# Patient Record
Sex: Male | Born: 1965 | Race: Black or African American | Hispanic: No | Marital: Married | Smoking: Current every day smoker
Health system: Southern US, Community
[De-identification: ages and names within clinical notes are randomized; demographics above are authoritative.]

## PROBLEM LIST (undated history)

## (undated) DIAGNOSIS — I10 Essential (primary) hypertension: Secondary | ICD-10-CM

## (undated) DIAGNOSIS — D539 Nutritional anemia, unspecified: Secondary | ICD-10-CM

## (undated) DIAGNOSIS — K644 Residual hemorrhoidal skin tags: Secondary | ICD-10-CM

## (undated) DIAGNOSIS — K5790 Diverticulosis of intestine, part unspecified, without perforation or abscess without bleeding: Secondary | ICD-10-CM

## (undated) DIAGNOSIS — E785 Hyperlipidemia, unspecified: Secondary | ICD-10-CM

## (undated) DIAGNOSIS — M109 Gout, unspecified: Secondary | ICD-10-CM

## (undated) DIAGNOSIS — K648 Other hemorrhoids: Secondary | ICD-10-CM

## (undated) DIAGNOSIS — K760 Fatty (change of) liver, not elsewhere classified: Secondary | ICD-10-CM

## (undated) HISTORY — DX: Hyperlipidemia, unspecified: E78.5

## (undated) HISTORY — DX: Essential (primary) hypertension: I10

## (undated) HISTORY — DX: Other hemorrhoids: K64.8

## (undated) HISTORY — DX: Residual hemorrhoidal skin tags: K64.4

## (undated) HISTORY — DX: Gout, unspecified: M10.9

## (undated) HISTORY — DX: Fatty (change of) liver, not elsewhere classified: K76.0

## (undated) HISTORY — DX: Diverticulosis of intestine, part unspecified, without perforation or abscess without bleeding: K57.90

## (undated) HISTORY — PX: HERNIA REPAIR: SHX51

## (undated) HISTORY — DX: Nutritional anemia, unspecified: D53.9

---

## 2010-08-08 ENCOUNTER — Ambulatory Visit (HOSPITAL_BASED_OUTPATIENT_CLINIC_OR_DEPARTMENT_OTHER): Admission: RE | Admit: 2010-08-08 | Discharge: 2010-08-08 | Payer: Self-pay | Admitting: Surgery

## 2011-01-26 LAB — POCT HEMOGLOBIN-HEMACUE: Hemoglobin: 15.6 g/dL (ref 13.0–17.0)

## 2020-02-04 ENCOUNTER — Encounter: Payer: Self-pay | Admitting: *Deleted

## 2020-02-04 ENCOUNTER — Telehealth: Payer: Self-pay | Admitting: Internal Medicine

## 2020-02-04 NOTE — Telephone Encounter (Signed)
Recv'd medical records from [facility](which included labs)forwared 9 pages to Dr. Erick Blinks via fax & interoffice 3/24/21fbg

## 2020-02-05 ENCOUNTER — Ambulatory Visit (INDEPENDENT_AMBULATORY_CARE_PROVIDER_SITE_OTHER): Payer: No Typology Code available for payment source | Admitting: Internal Medicine

## 2020-02-05 ENCOUNTER — Encounter: Payer: Self-pay | Admitting: *Deleted

## 2020-02-05 ENCOUNTER — Other Ambulatory Visit (INDEPENDENT_AMBULATORY_CARE_PROVIDER_SITE_OTHER): Payer: No Typology Code available for payment source

## 2020-02-05 VITALS — BP 126/66 | HR 103 | Temp 99.0°F | Ht 69.0 in | Wt 193.0 lb

## 2020-02-05 DIAGNOSIS — K625 Hemorrhage of anus and rectum: Secondary | ICD-10-CM

## 2020-02-05 DIAGNOSIS — Z01818 Encounter for other preprocedural examination: Secondary | ICD-10-CM | POA: Diagnosis not present

## 2020-02-05 DIAGNOSIS — R7989 Other specified abnormal findings of blood chemistry: Secondary | ICD-10-CM

## 2020-02-05 DIAGNOSIS — D539 Nutritional anemia, unspecified: Secondary | ICD-10-CM | POA: Diagnosis not present

## 2020-02-05 DIAGNOSIS — R55 Syncope and collapse: Secondary | ICD-10-CM

## 2020-02-05 DIAGNOSIS — F1010 Alcohol abuse, uncomplicated: Secondary | ICD-10-CM

## 2020-02-05 LAB — IBC PANEL
Iron: 238 ug/dL — ABNORMAL HIGH (ref 42–165)
Saturation Ratios: 53.5 % — ABNORMAL HIGH (ref 20.0–50.0)
Transferrin: 318 mg/dL (ref 212.0–360.0)

## 2020-02-05 LAB — CBC WITH DIFFERENTIAL/PLATELET
Basophils Absolute: 0 10*3/uL (ref 0.0–0.1)
Basophils Relative: 0.6 % (ref 0.0–3.0)
Eosinophils Absolute: 0 10*3/uL (ref 0.0–0.7)
Eosinophils Relative: 0.2 % (ref 0.0–5.0)
HCT: 35.8 % — ABNORMAL LOW (ref 39.0–52.0)
Hemoglobin: 12.4 g/dL — ABNORMAL LOW (ref 13.0–17.0)
Lymphocytes Relative: 30.3 % (ref 12.0–46.0)
Lymphs Abs: 2.3 10*3/uL (ref 0.7–4.0)
MCHC: 34.6 g/dL (ref 30.0–36.0)
MCV: 109.4 fl — ABNORMAL HIGH (ref 78.0–100.0)
Monocytes Absolute: 0.6 10*3/uL (ref 0.1–1.0)
Monocytes Relative: 8.3 % (ref 3.0–12.0)
Neutro Abs: 4.6 10*3/uL (ref 1.4–7.7)
Neutrophils Relative %: 60.6 % (ref 43.0–77.0)
Platelets: 196 10*3/uL (ref 150.0–400.0)
RBC: 3.28 Mil/uL — ABNORMAL LOW (ref 4.22–5.81)
RDW: 13.4 % (ref 11.5–15.5)
WBC: 7.6 10*3/uL (ref 4.0–10.5)

## 2020-02-05 LAB — VITAMIN B12: Vitamin B-12: 205 pg/mL — ABNORMAL LOW (ref 211–911)

## 2020-02-05 LAB — FERRITIN: Ferritin: 681.3 ng/mL — ABNORMAL HIGH (ref 22.0–322.0)

## 2020-02-05 LAB — FOLATE: Folate: 6.4 ng/mL (ref >5.9)

## 2020-02-05 NOTE — Patient Instructions (Addendum)
Your provider has requested that you go to the basement level for lab work before leaving today. Press "B" on the elevator. The lab is located at the first door on the left as you exit the elevator. ______________________________________________________  Please decrease your alcohol intake. ______________________________________________________  Make sure to drink plenty of fluids! Drink at least 64 ounces daily.  ______________________________________________________ Joel Stephenson have been scheduled for an abdominal ultrasound at [facility] (1st floor of hospital) on Tuesday 02/10/20 at 8:30 am. Please arrive 15 minutes prior to your appointment for registration. Make certain not to have anything to eat or drink 6 hours prior to your appointment. Should you need to reschedule your appointment, please contact radiology at [phone]. This test typically takes about 30 minutes to perform.  _______________________________________________________ Joel Stephenson have been scheduled for an endoscopy and colonoscopy. Please follow the written instructions given to you at your visit today. Please pick up your prep supplies at the pharmacy within the next 1-3 days. If you use inhalers (even only as needed), please bring them with you on the day of your procedure. Your physician has requested that you go to www.startemmi.com and enter the access code given to you at your visit today. This web site gives a general overview about your procedure. However, you should still follow specific instructions given to you by our office regarding your preparation for the procedure.  _____________________________________________________ If you are age 87 or older, your body mass index should be between 23-30. Your Body mass index is 28.5 kg/m. If this is out of the aforementioned range listed, please consider follow up with your Primary Care Provider.  If you are age 52 or younger, your body mass index should be between  19-25. Your Body mass index is 28.5 kg/m. If this is out of the aformentioned range listed, please consider follow up with your Primary Care Provider.  _____________________________________________________ Due to recent changes in healthcare laws, you may see the results of your imaging and laboratory studies on MyChart before your provider has had a chance to review them.  We understand that in some cases there may be results that are confusing or concerning to you. Not all laboratory results come back in the same time frame and the provider may be waiting for multiple results in order to interpret others.  Please give [place] 48 hours in order for your provider to thoroughly review all the results before contacting the office for clarification of your results.  _____________________________________________________  We gave given you a return to work note.

## 2020-02-05 NOTE — Progress Notes (Signed)
Patient ID: Joel Stephenson, male   DOB: [birth year], 54 y.o.   MRN: [id number] HPI: Joel Stephenson is a 54 year old male with a history of hypertension, hyperlipidemia and gout who is seen in consultation at the request of Marin Comment, FNP to evaluate rectal bleeding and blood in stools.  He is here alone today.  He reports that over the last year he has seen blood in his bowel movements.  Not daily but on a regular basis.  Blood described as red but at other times dark red.  Does not really occur with wiping but more mixed with stool.  He has a bowel movement about every 3 days.  At times he feels constipated but this is not specifically a change.  He reports he works in [employer] and tries to avoid having bowel movements at work.  He denies abdominal pain.  Good appetite.  Stable weight.  His weight has always fluctuated he says between 185 and 200 pounds.  He often gains weight during the winter and loses weight during the summer.  He has occasional heartburn without dysphagia or odynophagia.  He has never had a colonoscopy.  There is no known family history of colon cancer.  Separately he reports 3 episodes of syncope the last occurring about a week ago.  These have occurred when rising quickly from a seated position after having a bowel movement.  He reports he will stand after bowel movement, on these 3 occasions, feel lightheaded and sweaty on the back of his neck and then pass out.  Never happened while seated position nor while working.  No exertional chest pain or dyspnea.  I reviewed lab work from primary care and noticed his liver enzymes were elevated.  We discussed this today.  He was unaware.  He does report drinking alcohol more over the last few years.  He reports he does this to help sleep and also to help with his joint pain specifically his bilateral knee pain.  He reports he needs joint replacements in both knees.  He will drink about a half bottle of vodka most days and usually 1 beer.   He reports in the past he never really drank hard liquor but has become a habit recently.  No jaundice.  No increasing abdominal girth or lower extremity edema.  Past Medical History:  Diagnosis Date  . Gout   . Hyperlipidemia   . Hypertension     Past Surgical History:  Procedure Laterality Date  . HERNIA REPAIR      Outpatient Medications Prior to Visit  Medication Sig Dispense Refill  . allopurinol (ZYLOPRIM) 100 MG tablet Take 100 mg by mouth 2 (two) times daily as needed.    Marland Kitchen atorvastatin (LIPITOR) 10 MG tablet Take 10 mg by mouth daily.    . diclofenac (VOLTAREN) 75 MG EC tablet Take 75 mg by mouth 2 (two) times daily.    Marland Kitchen losartan-hydrochlorothiazide (HYZAAR) 100-12.5 MG tablet Take 1 tablet by mouth daily.    . indomethacin (INDOCIN SR) 75 MG CR capsule Take 75 mg by mouth 2 (two) times daily with a meal.     No facility-administered medications prior to visit.    No Known Allergies  Family History  Problem Relation Age of Onset  . Hypertension Mother   . Diabetes Father   . Colon cancer Neg Hx   . Esophageal cancer Neg Hx   . Stomach cancer Neg Hx     Social History   Tobacco Use  .  Smoking status: Current Every Day Smoker  . Smokeless tobacco: Never Used  Substance Use Topics  . Alcohol use: Yes  . Drug use: Not Currently    ROS: As per history of present illness, otherwise negative  Pulse 99   Ht 5\' 9"  (1.753 m)   Wt 193 lb (87.5 kg)   BMI 28.50 kg/m  Constitutional: Well-developed and well-nourished. No distress. HEENT: Normocephalic and atraumatic. Oropharynx is clear and moist. Conjunctivae are normal.  No scleral icterus. Neck: Neck supple. Trachea midline. Cardiovascular: Normal rate, regular rhythm and intact distal pulses. No M/R/G Pulmonary/chest: Effort normal and breath sounds normal. No wheezing, rales or rhonchi. Abdominal: Soft, nontender, nondistended. Bowel sounds active throughout. There are no masses palpable. Extremities: no  clubbing, cyanosis, or edema Neurological: Alert and oriented to person place and time. Skin: Skin is warm and dry.  Psychiatric: Normal mood and affect. Behavior is normal.  Labs reviewed from primary care dated 01/23/2020 Hemoglobin 12.4, MCV 106, white count 6.9, platelet count 191 BUN 19, creatinine 1.1 AST 131, ALT 80, alkaline phosphatase 62, total bilirubin 0.5, albumin 4.5 TSH normal Hemoglobin A1c 5.3% PSA 1.2   ASSESSMENT/PLAN: 54 year old male with a history of hypertension, hyperlipidemia and gout who is seen in consultation at the request of Suzan Garibaldi, FNP to evaluate rectal bleeding and blood in stools.  1.  Rectal bleeding with anemia --I recommended colonoscopy.  We discussed the risk, benefits and alternatives and he is agreeable and wishes to proceed.  Rule out colitis, polyps and malignancy.  2.  Macrocytic anemia --likely related to alcohol.  Given persistent rectal bleeding I would like to exclude iron deficiency as well.  Rule out B12 and folate deficiency --CBC, ferritin plus IBC panel, B12 and folate  3.  Alcohol use/abuse/elevated liver enzymes --we discussed his elevated liver enzymes today.  The pattern of his transaminitis is most consistent with alcohol-related inflammation.  We discussed this at length today.  I have recommended that he work to cut back significantly on his daily alcohol intake.  There is no overt evidence for decompensated liver disease or cirrhosis but we did discuss with ongoing alcohol overuse and liver inflammation that he would be at risk for cirrhosis over time.  I recommended that we check some additional liver labs but also do an ultrasound of the liver --Reduce daily alcohol intake targeting no more than 2 measured alcoholic drinks per day --Abdominal ultrasound --Check viral hepatitis serologies  4.  Syncope --very likely vasovagal syncope given that each episode has occurred when standing quickly after defecation.  I do think it  is okay for him to return to work but that he needs to be particularly careful when he changes positions from seated to standing.  I also encouraged him to focus on hydration and drinking plenty of water throughout the day.  Also reducing alcohol intake will help with relative volume depletion/dehydration.      Cc: Suzan Garibaldi, FNP

## 2020-02-06 LAB — HEPATITIS B SURFACE ANTIBODY,QUALITATIVE: Hep B S Ab: NONREACTIVE

## 2020-02-06 LAB — HEPATITIS B SURFACE ANTIGEN: Hepatitis B Surface Ag: NONREACTIVE

## 2020-02-06 LAB — HEPATITIS A ANTIBODY, TOTAL: Hepatitis A AB,Total: NONREACTIVE

## 2020-02-06 LAB — HEPATITIS C ANTIBODY
Hepatitis C Ab: NONREACTIVE
SIGNAL TO CUT-OFF: 0.01 (ref <1.00)

## 2020-02-06 LAB — HEPATITIS B CORE ANTIBODY, TOTAL: Hep B Core Total Ab: NONREACTIVE

## 2020-02-09 ENCOUNTER — Encounter: Payer: Self-pay | Admitting: Internal Medicine

## 2020-02-10 ENCOUNTER — Ambulatory Visit (HOSPITAL_COMMUNITY)
Admission: RE | Admit: 2020-02-10 | Discharge: 2020-02-10 | Disposition: A | Discharge status: Home / Self Care | Payer: No Typology Code available for payment source | Source: Ambulatory Visit | Attending: Internal Medicine | Admitting: Internal Medicine

## 2020-02-10 ENCOUNTER — Other Ambulatory Visit: Payer: Self-pay

## 2020-02-10 DIAGNOSIS — R7989 Other specified abnormal findings of blood chemistry: Secondary | ICD-10-CM | POA: Insufficient documentation

## 2020-02-17 NOTE — Telephone Encounter (Signed)
error 

## 2020-02-27 ENCOUNTER — Ambulatory Visit (INDEPENDENT_AMBULATORY_CARE_PROVIDER_SITE_OTHER): Payer: No Typology Code available for payment source

## 2020-02-27 ENCOUNTER — Other Ambulatory Visit: Payer: Self-pay | Admitting: Internal Medicine

## 2020-02-27 DIAGNOSIS — Z1159 Encounter for screening for other viral diseases: Secondary | ICD-10-CM

## 2020-02-28 LAB — SARS CORONAVIRUS 2 (TAT 6-24 HRS): SARS Coronavirus 2: NEGATIVE

## 2020-03-01 ENCOUNTER — Telehealth: Payer: Self-pay | Admitting: Internal Medicine

## 2020-03-01 MED ORDER — SUPREP BOWEL PREP KIT 17.5-3.13-1.6 GM/177ML PO SOLN: 1.0000 | ORAL | 0 refills | Status: DC

## 2020-03-01 NOTE — Telephone Encounter (Signed)
Rx sent. Patient advised. 

## 2020-03-02 ENCOUNTER — Ambulatory Visit (AMBULATORY_SURGERY_CENTER): Payer: No Typology Code available for payment source | Admitting: Internal Medicine

## 2020-03-02 ENCOUNTER — Encounter: Payer: Self-pay | Admitting: Internal Medicine

## 2020-03-02 ENCOUNTER — Other Ambulatory Visit: Payer: Self-pay

## 2020-03-02 VITALS — BP 127/77 | HR 85 | Temp 97.7°F | Resp 19 | Ht 69.0 in | Wt 193.0 lb

## 2020-03-02 DIAGNOSIS — K625 Hemorrhage of anus and rectum: Secondary | ICD-10-CM

## 2020-03-02 DIAGNOSIS — K648 Other hemorrhoids: Secondary | ICD-10-CM

## 2020-03-02 DIAGNOSIS — K5730 Diverticulosis of large intestine without perforation or abscess without bleeding: Secondary | ICD-10-CM | POA: Diagnosis not present

## 2020-03-02 MED ORDER — SODIUM CHLORIDE 0.9 % IV SOLN: 500.0000 mL | Freq: Once | INTRAVENOUS | Status: DC

## 2020-03-02 NOTE — Op Note (Signed)
[facility] Patient Name: Joel Stephenson Procedure Date: 03/02/2020 3:07 PM MRN: [id number] Endoscopist: Beverley Fiedler , MD Age: 54 Referring MD:  Date of Birth: July 09, 1966 Gender: Male Account #: [id number] Procedure:                Colonoscopy Indications:              Rectal bleeding Medicines:                Monitored Anesthesia Care Procedure:                Pre-Anesthesia Assessment:                           - Prior to the procedure, a History and Physical                            was performed, and patient medications and                            allergies were reviewed. The patient's tolerance of                            previous anesthesia was also reviewed. The risks                            and benefits of the procedure and the sedation                            options and risks were discussed with the patient.                            All questions were answered, and informed consent                            was obtained. Prior Anticoagulants: The patient has                            taken no previous anticoagulant or antiplatelet                            agents. ASA Grade Assessment: II - A patient with                            mild systemic disease. After reviewing the risks                            and benefits, the patient was deemed in                            satisfactory condition to undergo the procedure.                           After obtaining informed consent, the colonoscope  was passed under direct vision. Throughout the                            procedure, the patient's blood pressure, pulse, and                            oxygen saturations were monitored continuously. The                            Colonoscope was introduced through the anus and                            advanced to the cecum, identified by appendiceal                            orifice and ileocecal valve. The colonoscopy was                             performed without difficulty. The patient tolerated                            the procedure well. The quality of the bowel                            preparation was good. The ileocecal valve,                            appendiceal orifice, and rectum were photographed. Scope In: 3:18:53 PM Scope Out: 3:31:55 PM Scope Withdrawal Time: 0 hours 10 minutes 39 seconds  Total Procedure Duration: 0 hours 13 minutes 2 seconds  Findings:                 Hemorrhoids were found on perianal exam.                           Multiple small and large-mouthed diverticula were                            found in the sigmoid colon, descending colon and                            ascending colon.                           Internal hemorrhoids were found during retroflexion                            and during digital exam. The hemorrhoids were                            medium-sized. Complications:            No immediate complications. Estimated Blood Loss:     Estimated blood loss: none. Impression:               - Moderate diverticulosis in the sigmoid colon,  in                            the descending colon and in the ascending colon.                           - Internal hemorrhoids.                           - No specimens collected. Recommendation:           - Patient has a contact number available for                            emergencies. The signs and symptoms of potential                            delayed complications were discussed with the                            patient. Return to normal activities tomorrow.                            Written discharge instructions were provided to the                            patient.                           - Resume previous diet.                           - Continue present medications.                           - Hemorrhoidal banding would be a good option if                            rectal bleeding persists.                            - Repeat colonoscopy in 10 years for screening                            purposes.                           - Office follow-up is recommended in 3 to 6 months. Beverley Fiedler, MD 03/02/2020 3:39:55 PM This report has been signed electronically.

## 2020-03-02 NOTE — Patient Instructions (Signed)
Discharge instructions given. Handouts on Diverticulosis and hemorrhoids. Resume previous medications. YOU HAD AN ENDOSCOPIC PROCEDURE TODAY AT THE [facility]:   Refer to the procedure report that was given to you for any specific questions about what was found during the examination.  If the procedure report does not answer your questions, please call your gastroenterologist to clarify.  If you requested that your care partner not be given the details of your procedure findings, then the procedure report has been included in a sealed envelope for you to review at your convenience later.  YOU SHOULD EXPECT: Some feelings of bloating in the abdomen. Passage of more gas than usual.  Walking can help get rid of the air that was put into your GI tract during the procedure and reduce the bloating. If you had a lower endoscopy (such as a colonoscopy or flexible sigmoidoscopy) you may notice spotting of blood in your stool or on the toilet paper. If you underwent a bowel prep for your procedure, you may not have a normal bowel movement for a few days.  Please Note:  You might notice some irritation and congestion in your nose or some drainage.  This is from the oxygen used during your procedure.  There is no need for concern and it should clear up in a day or so.  SYMPTOMS TO REPORT IMMEDIATELY:   Following lower endoscopy (colonoscopy or flexible sigmoidoscopy):  Excessive amounts of blood in the stool  Significant tenderness or worsening of abdominal pains  Swelling of the abdomen that is new, acute  Fever of 100F or higher  For urgent or emergent issues, a gastroenterologist can be reached at any hour by calling [phone]. Do not use MyChart messaging for urgent concerns.    DIET:  We do recommend a small meal at first, but then you may proceed to your regular diet.  Drink plenty of fluids but you should avoid alcoholic beverages for 24 hours.  ACTIVITY:  You should plan to  take it easy for the rest of today and you should NOT DRIVE or use heavy machinery until tomorrow (because of the sedation medicines used during the test).    FOLLOW UP: Our staff will call the number listed on your records 48-72 hours following your procedure to check on you and address any questions or concerns that you may have regarding the information given to you following your procedure. If we do not reach you, we will leave a message.  We will attempt to reach you two times.  During this call, we will ask if you have developed any symptoms of COVID 19. If you develop any symptoms (ie: fever, flu-like symptoms, shortness of breath, cough etc.) before then, please call [phone].  If you test positive for Covid 19 in the 2 weeks post procedure, please call and report this information to us.    If any biopsies were taken you will be contacted by phone or by letter within the next 1-3 weeks.  Please call us at [phone] if you have not heard about the biopsies in 3 weeks.    SIGNATURES/CONFIDENTIALITY: You and/or your care partner have signed paperwork which will be entered into your electronic medical record.  These signatures attest to the fact that that the information above on your After Visit Summary has been reviewed and is understood.  Full responsibility of the confidentiality of this discharge information lies with you and/or your care-partner. 

## 2020-03-02 NOTE — Progress Notes (Signed)
Report given to PACU, vss 

## 2020-03-02 NOTE — Progress Notes (Signed)
Temp by LC Vitals by DT 

## 2020-03-04 ENCOUNTER — Telehealth: Payer: Self-pay | Admitting: *Deleted

## 2020-03-04 NOTE — Telephone Encounter (Signed)
Attempted f/u phone call. No answer. Left message. °

## 2020-03-04 NOTE — Telephone Encounter (Signed)
  Follow up Call-  Call back number 03/02/2020  Post procedure Call Back phone  # [phone]  Permission to leave phone message Yes  Some recent data might be hidden     Patient questions:  Message left message to call us if necessary.  Second call.

## 2020-05-05 ENCOUNTER — Encounter: Payer: Self-pay | Admitting: *Deleted

## 2020-05-11 ENCOUNTER — Encounter: Payer: Self-pay | Admitting: Internal Medicine

## 2020-05-11 ENCOUNTER — Ambulatory Visit (INDEPENDENT_AMBULATORY_CARE_PROVIDER_SITE_OTHER): Payer: No Typology Code available for payment source | Admitting: Internal Medicine

## 2020-05-11 ENCOUNTER — Other Ambulatory Visit (INDEPENDENT_AMBULATORY_CARE_PROVIDER_SITE_OTHER): Payer: No Typology Code available for payment source

## 2020-05-11 VITALS — BP 140/80 | HR 84 | Ht 69.0 in | Wt 197.1 lb

## 2020-05-11 DIAGNOSIS — Z23 Encounter for immunization: Secondary | ICD-10-CM

## 2020-05-11 DIAGNOSIS — K760 Fatty (change of) liver, not elsewhere classified: Secondary | ICD-10-CM

## 2020-05-11 DIAGNOSIS — K648 Other hemorrhoids: Secondary | ICD-10-CM

## 2020-05-11 DIAGNOSIS — F1010 Alcohol abuse, uncomplicated: Secondary | ICD-10-CM

## 2020-05-11 DIAGNOSIS — R7989 Other specified abnormal findings of blood chemistry: Secondary | ICD-10-CM | POA: Diagnosis not present

## 2020-05-11 DIAGNOSIS — E538 Deficiency of other specified B group vitamins: Secondary | ICD-10-CM

## 2020-05-11 LAB — PROTIME-INR
INR: 1.1 ratio — ABNORMAL HIGH (ref 0.8–1.0)
Prothrombin Time: 12.6 s (ref 9.6–13.1)

## 2020-05-11 LAB — COMPREHENSIVE METABOLIC PANEL
ALT: 21 U/L (ref 0–53)
AST: 26 U/L (ref 0–37)
Albumin: 4.5 g/dL (ref 3.5–5.2)
Alkaline Phosphatase: 57 U/L (ref 39–117)
BUN: 30 mg/dL — ABNORMAL HIGH (ref 6–23)
CO2: 28 mEq/L (ref 19–32)
Calcium: 9.8 mg/dL (ref 8.4–10.5)
Chloride: 100 mEq/L (ref 96–112)
Creatinine, Ser: 1.2 mg/dL (ref 0.40–1.50)
GFR: 75.85 mL/min (ref >60.00)
Glucose, Bld: 89 mg/dL (ref 70–99)
Potassium: 4.2 mEq/L (ref 3.5–5.1)
Sodium: 138 mEq/L (ref 135–145)
Total Bilirubin: 0.7 mg/dL (ref 0.2–1.2)
Total Protein: 8.2 g/dL (ref 6.0–8.3)

## 2020-05-11 LAB — IBC + FERRITIN
Ferritin: 242.6 ng/mL (ref 22.0–322.0)
Iron: 144 ug/dL (ref 42–165)
Saturation Ratios: 30.9 % (ref 20.0–50.0)
Transferrin: 333 mg/dL (ref 212.0–360.0)

## 2020-05-11 LAB — FOLATE: Folate: >24.8 ng/mL (ref >5.9)

## 2020-05-11 LAB — VITAMIN B12: Vitamin B-12: 462 pg/mL (ref 211–911)

## 2020-05-11 NOTE — Patient Instructions (Addendum)
Your provider has requested that you go to the basement level for lab work before leaving today. Press "B" on the elevator. The lab is located at the first door on the left as you exit the elevator.  Continue B12.  Continue folic acid.  Discontinue oral iron.  Continue to work on alcohol reduction.  Call our office if you decide to have hemorrhoidal banding.  Follow up with Dr Rhea Belton in the office in 4 months.  You have been given your 1st hepatitis A/B vaccine today. You are scheduled for your 2nd injection on Friday, 06/11/20 at 9:30 am.  If you are age 34 or older, your body mass index should be between 23-30. Your Body mass index is 29.11 kg/m. If this is out of the aforementioned range listed, please consider follow up with your Primary Care Provider.  If you are age 43 or younger, your body mass index should be between 19-25. Your Body mass index is 29.11 kg/m. If this is out of the aformentioned range listed, please consider follow up with your Primary Care Provider.   Due to recent changes in healthcare laws, you may see the results of your imaging and laboratory studies on MyChart before your provider has had a chance to review them.  We understand that in some cases there may be results that are confusing or concerning to you. Not all laboratory results come back in the same time frame and the provider may be waiting for multiple results in order to interpret others.  Please give [place] 48 hours in order for your provider to thoroughly review all the results before contacting the office for clarification of your results.

## 2020-05-11 NOTE — Progress Notes (Signed)
Subjective:    Patient ID: Joel Stephenson, male    DOB: 01/11/1966, 54 y.o.   MRN: [id number]  HPI Michae Grimley is a 54 year old male with PMH of elevated liver enzymes/fatty liver, excessive alcohol use, B12 deficiency, bleeding internal hemorrhoids, hypertension, hyperlipidemia and gout who is here for follow-up.  He is here alone today.  I initially saw him on 02/05/2020 in office consultation and for colonoscopy on 03/02/2020  Colonoscopy revealed diverticulosis in the ascending, descending and sigmoid colon and medium-sized internal hemorrhoids.  After this visit we also did an abdominal ultrasound to evaluate elevated liver enzymes which showed hepatic steatosis.  There was a 2.7 cm ectatic abdominal aorta at risk for aneurysm and 5-year follow-up was recommended.  He reports that he has been feeling fairly well.  He continues to have significant bilateral knee pain.  He has had medical management including injections for this knee pain but it is significant particularly after work and at night.  He has been told that he has bone-on-bone and needs replacement.  He admits that his alcohol use in the evening is driven predominantly by this knee pain and trying to improve the knee pain and allow him to rest.  He has significantly reduced his liquor and alcohol intake.  He was previously drinking upwards of 1/5 of vodka per day and he is cut this by at least 50% and he is no longer drinking every day.  No abdominal pain.  His bowel movements have been regular.  No upper GI or hepatobiliary complaint today.  He has had less blood with bowel movement since being seen and recalls seeing this maybe once since his colonoscopy.  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  He has been taking folate and B12 supplementation since I recommended it after labs in March 2020.  He does feel improved energy level since starting B12.   Review of  Systems As per HPI, otherwise negative  Current Medications, Allergies, Past Medical History, Past Surgical History, Family History and Social History were reviewed in [employer] record.      Objective:   Physical Exam BP 140/80 (BP Location: Left Arm, Patient Position: Sitting, Cuff Size: Normal)   Pulse 84   Ht 5\' 9"  (1.753 m) Comment: height measured without shoes  Wt 197 lb 2 oz (89.4 kg)   BMI 29.11 kg/m  Gen: awake, alert, NAD HEENT: anicteric Ext: no c/c/e Neuro: nonfocal  Lab Results  Component Value Date   INR 1.1 (H) 05/11/2020   ABDOMEN ULTRASOUND COMPLETE   COMPARISON:  None.   FINDINGS: Gallbladder: No gallstones or wall thickening visualized. No sonographic Murphy sign noted by sonographer.   Common bile duct: Diameter: 2.8 mm   Liver: No focal lesion identified. Increased hepatic parenchymal echogenicity. Portal vein is patent on color Doppler imaging with normal direction of blood flow towards the liver.   IVC: No abnormality visualized.   Pancreas: Limited visualization secondary to overlying bowel gas.   Spleen: Size and appearance within normal limits.   Right Kidney: Length: 9.8 cm. Echogenicity within normal limits. No mass or hydronephrosis visualized.   Left Kidney: Length: 10.8 cm. Echogenicity within normal limits. No mass or hydronephrosis visualized.   Abdominal aorta: Ectatic abdominal aorta measuring 2.7 cm.   Other findings: None.   IMPRESSION: 1. Increased hepatic echogenicity as can be seen with hepatic steatosis. 2. 2.7 cm ectatic abdominal  aorta, at risk for aneurysm development. Recommend follow-up aortic ultrasound in 5 years. This recommendation follows ACR consensus guidelines: White Paper of the ACR Incidental Findings Committee II on Vascular Findings. J Am Coll Radiol 2013; 10:789-794.     Electronically Signed   By: Elige Ko   On: 02/10/2020 14:16     Assessment & Plan:  54 year  old male with PMH of elevated liver enzymes/fatty liver, excessive alcohol use, B12 deficiency, bleeding internal hemorrhoids, hypertension, hyperlipidemia and gout who is here for follow-up.  1. Elevated liver enzymes/elevated ferritin/alcohol overuse--we discussed his liver enzyme elevation, elevated ferritin and alcohol overuse today.  He has significantly reduced alcohol intake by 50% but is still drinking more than the recommended daily amount.  We also discussed his bilateral knee pain and that it would be better for him to have this addressed including possible bilateral knee replacement rather than use alcohol to treat this chronic pain.  He is in full agreement.  I would like to repeat liver enzymes but also perform other lab test to exclude other causes of liver inflammation.  His fatty liver seen by ultrasound is also being driven by alcohol. --Repeat CMP, check hemochromatosis gene DNA (though elevated ferritin likely related to alcohol as well), ANA, IgG, anti-smooth muscle, antimitochondrial, repeat iron studies, repeat B12 and folate --I encouraged him to continue to work on alcohol reduction and try to reduce by an additional 50% of current alcohol intake.  Goal would be no more than 2 measured alcoholic drinks per day --Twinrix vaccination series recommended and started today --He did report he was taking oral iron supplementation and I recommended he stop this  2.  Bleeding internal hemorrhoids --improved but symptomatic from time to time.  We discussed hemorrhoidal banding.  He will consider and let me know if he is interested.  This would likely medically improved symptoms.  3.  B12 deficiency --we started oral B12 3 months ago.  Repeat B12 level today.  Continue oral B12  4.  Borderline folate deficiency --continue folate supplementation daily.  This is also likely due to alcohol overuse.  5.  COVID-19 vaccination --he has not yet vaccinated himself against COVID-19 and I  recommended he do so.  We discussed the risk, benefits of vaccination and he will strongly consider this vaccine.  4 month followup  30 minutes total spent today including patient facing time, coordination of care, reviewing medical history/procedures/pertinent radiology studies, and documentation of the encounter.

## 2020-05-19 ENCOUNTER — Other Ambulatory Visit: Payer: Self-pay

## 2020-05-19 DIAGNOSIS — R7989 Other specified abnormal findings of blood chemistry: Secondary | ICD-10-CM

## 2020-05-19 LAB — MITOCHONDRIAL ANTIBODIES: Mitochondrial M2 Ab, IgG: <=20 U

## 2020-05-19 LAB — HEMOCHROMATOSIS DNA-PCR(C282Y,H63D)

## 2020-05-19 LAB — IGG: IgG (Immunoglobin G), Serum: 1372 mg/dL (ref 600–1640)

## 2020-05-19 LAB — ANA: Anti Nuclear Antibody (ANA): NEGATIVE

## 2020-05-19 LAB — ANTI-SMOOTH MUSCLE ANTIBODY, IGG: Actin (Smooth Muscle) Antibody (IGG): <20 U (ref <20)

## 2020-05-31 ENCOUNTER — Telehealth: Payer: Self-pay

## 2020-05-31 NOTE — Telephone Encounter (Signed)
Spoke with patient regarding lab results from 05/11/20. Pt advised that we will contact him in 3 months for repeat labs, pt aware that he should consider knee replacement surgery, pt agreeable he stated that he left work today due to pain, pt advised to keep up the good work.

## 2020-08-19 ENCOUNTER — Telehealth: Payer: Self-pay

## 2020-08-19 NOTE — Telephone Encounter (Signed)
Left detailed message reminding patient that he is due for repeat labs. Advised that no appt is necessary and he can go by the lab at his convenience between 7:30 AM - 5 PM, Monday through Friday. Advised patient to give [place] a call if he had any questions.

## 2020-08-19 NOTE — Telephone Encounter (Signed)
-----   Message from Missy Sabins, RN sent at 05/19/2020  9:26 AM EDT ----- Regarding: Labs Repeat CMP and INR, order in epic

## 2021-02-03 IMAGING — US US ABDOMEN COMPLETE
1 series · 13 of 25 positions shown · non-contrast
Comparison: None.

CLINICAL DATA: Intermittent bloody stools for 1 year. Increased
LFTs

EXAM:
ABDOMEN ULTRASOUND COMPLETE

[Series 1: us abdomen complete · 13 of 89 slices shown]
[im 1/89]
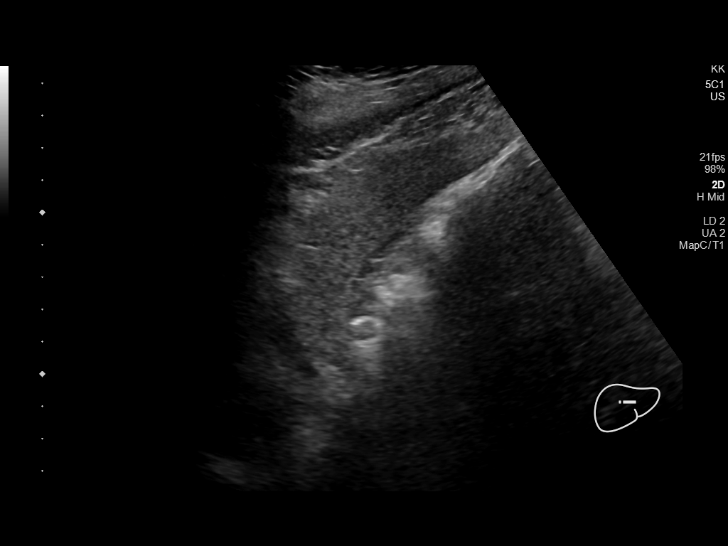
[im 8/89]
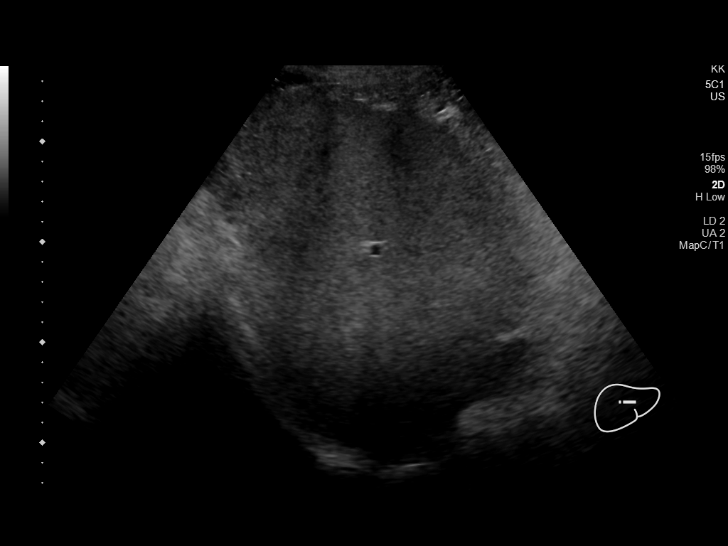
[im 15/89]
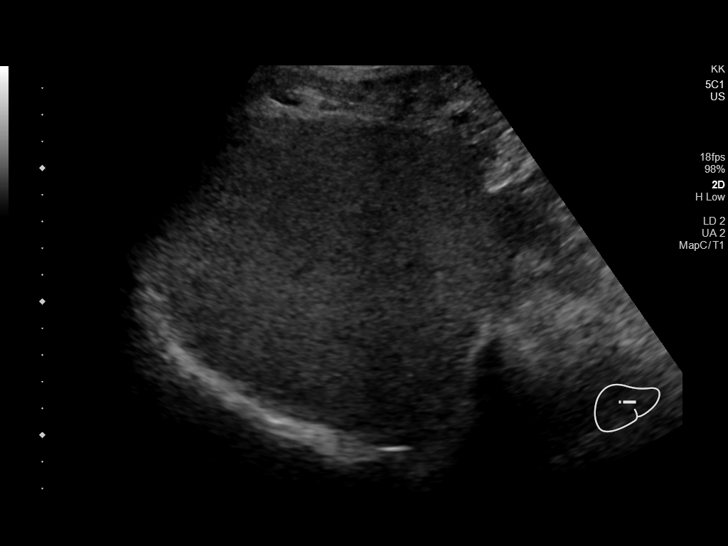
[im 23/89]
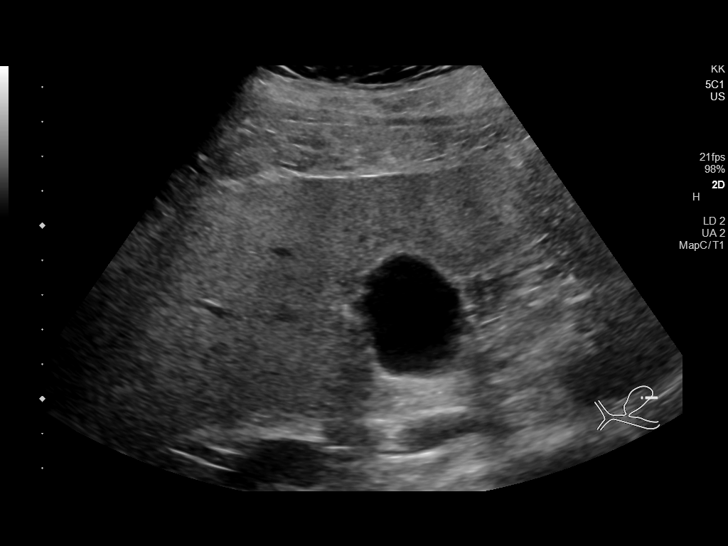
[im 30/89]
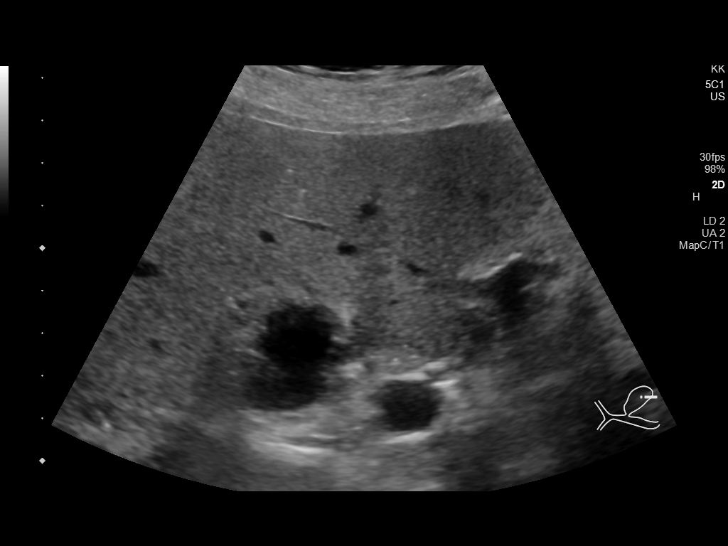
[im 37/89]
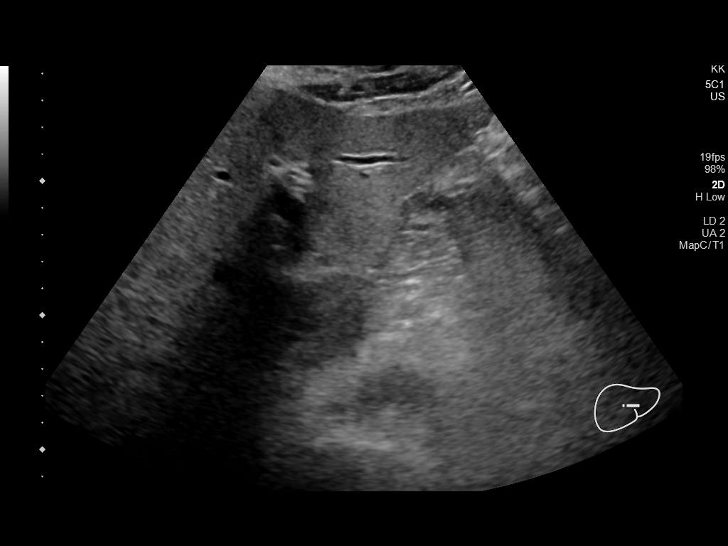
[im 45/89]
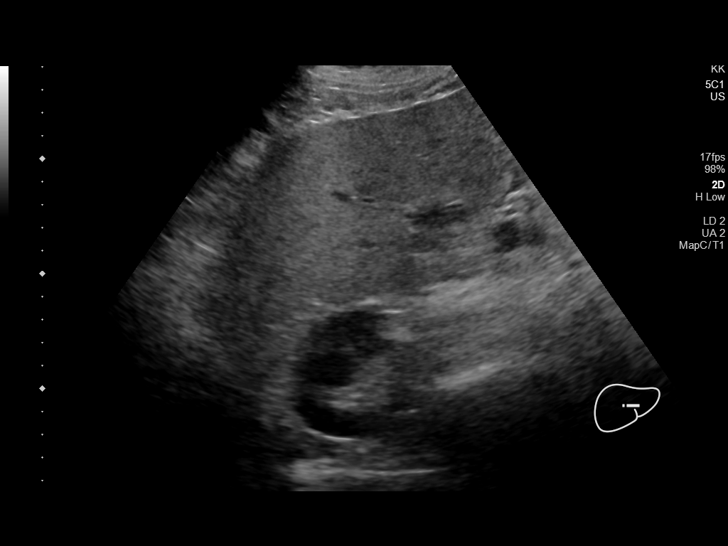
[im 52/89]
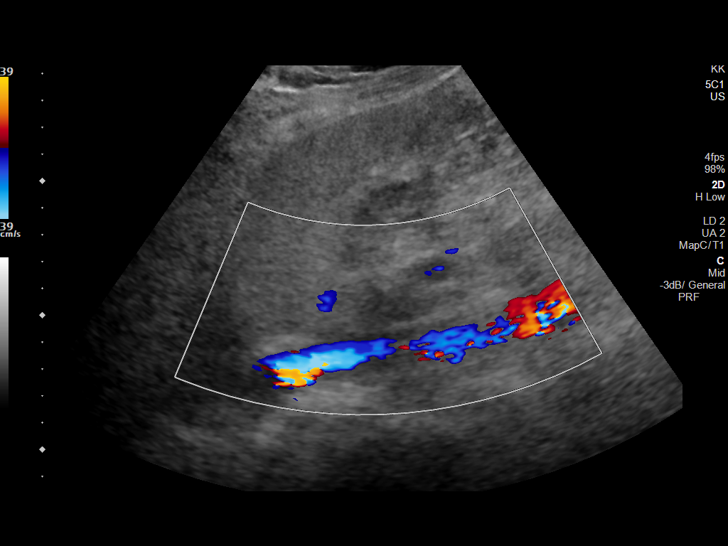
[im 59/89]
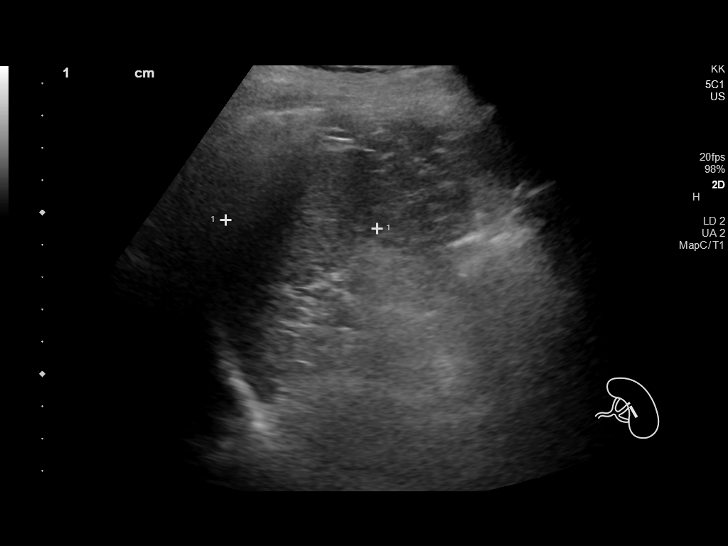
[im 67/89]
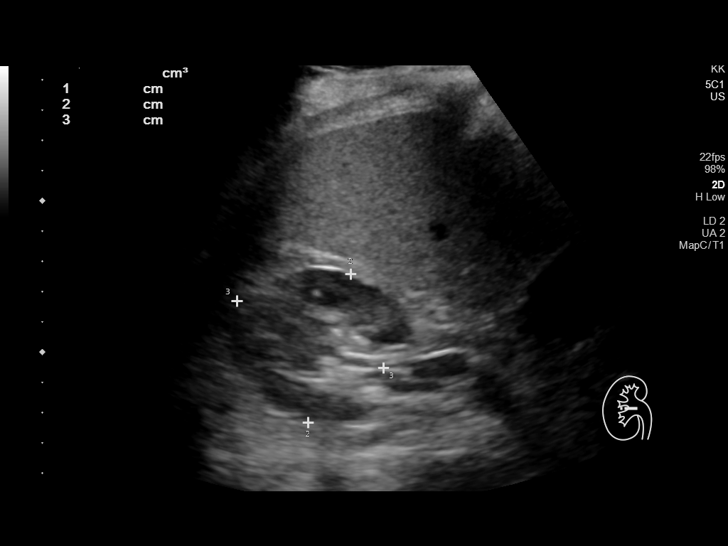
[im 74/89]
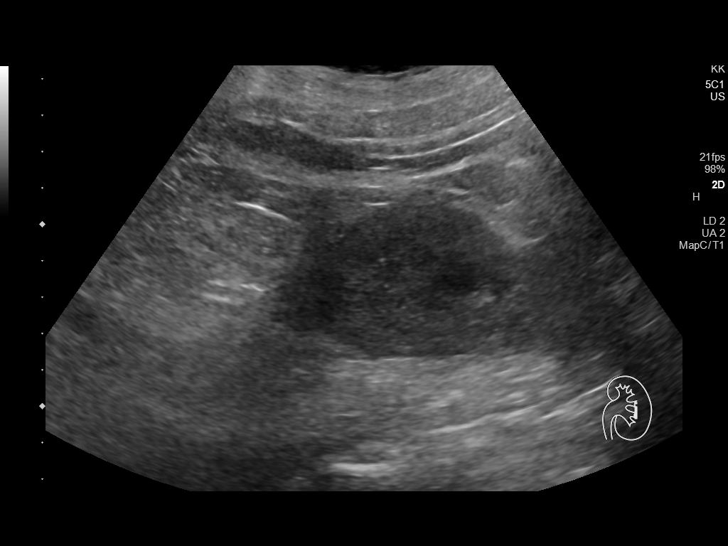
[im 81/89]
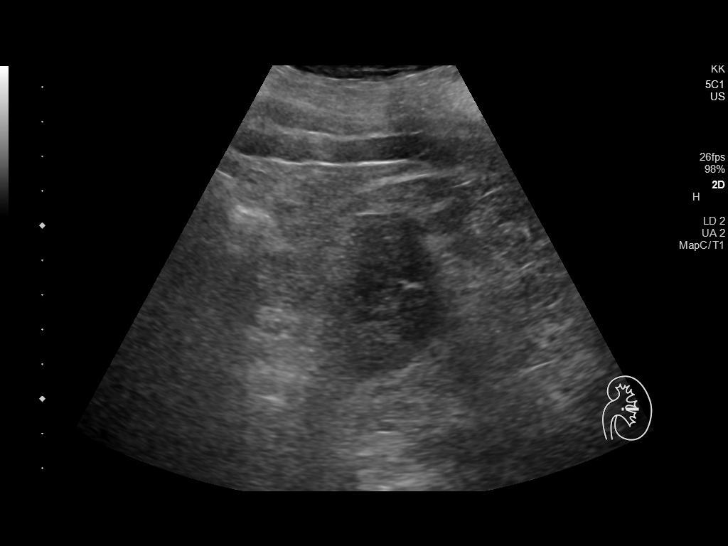
[im 89/89]
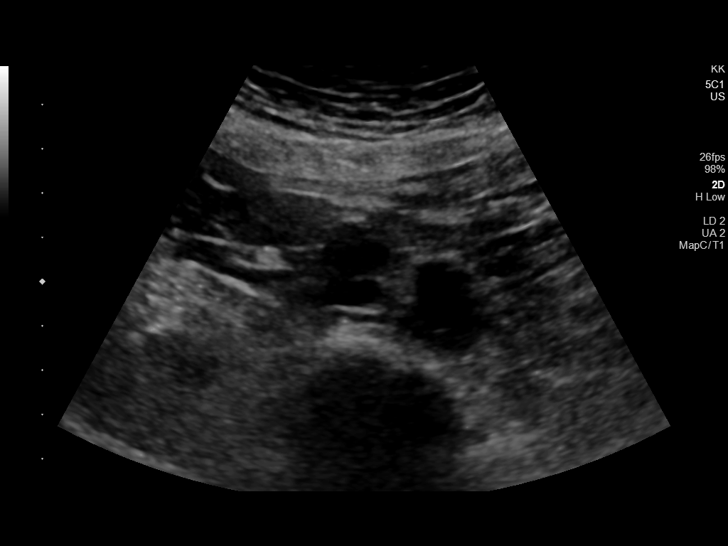

[13 of 25 positions shown; findings below may reference images not displayed]

FINDINGS: Gallbladder: No gallstones or wall thickening visualized. No
sonographic Murphy sign noted by sonographer.

Common bile duct: Diameter: 2.8 mm

Liver: No focal lesion identified. Increased hepatic parenchymal
echogenicity. Portal vein is patent on color Doppler imaging with
normal direction of blood flow towards the liver.

IVC: No abnormality visualized.

Pancreas: Limited visualization secondary to overlying bowel gas.

Spleen: Size and appearance within normal limits.

Right Kidney: Length: 9.8 cm. Echogenicity within normal limits. No
mass or hydronephrosis visualized.

Left Kidney: Length: 10.8 cm. Echogenicity within normal limits. No
mass or hydronephrosis visualized.

Abdominal aorta: Ectatic abdominal aorta measuring 2.7 cm.

Other findings: None.
IMPRESSION: 1. Increased hepatic echogenicity as can be seen with hepatic
steatosis.
2. 2.7 cm ectatic abdominal aorta, at risk for aneurysm development.
Recommend follow-up aortic ultrasound in 5 years. This
recommendation follows ACR consensus guidelines: White Paper of the
ACR Incidental Findings Committee II on Vascular Findings. [HOSPITAL] 0874; [DATE].

## 2021-10-27 ENCOUNTER — Encounter: Payer: Self-pay | Admitting: Podiatry

## 2021-10-27 ENCOUNTER — Ambulatory Visit (INDEPENDENT_AMBULATORY_CARE_PROVIDER_SITE_OTHER): Payer: BC Managed Care – PPO | Admitting: Podiatry

## 2021-10-27 ENCOUNTER — Other Ambulatory Visit: Payer: Self-pay

## 2021-10-27 DIAGNOSIS — M7752 Other enthesopathy of left foot: Secondary | ICD-10-CM | POA: Diagnosis not present

## 2021-10-27 DIAGNOSIS — M7742 Metatarsalgia, left foot: Secondary | ICD-10-CM | POA: Diagnosis not present

## 2021-10-31 NOTE — Progress Notes (Signed)
°  Subjective:  Patient ID: Joel Stephenson, male    DOB: 1966-04-13,  MRN: [id number]  Chief Complaint  Patient presents with   Callouses    I have a spot on the ball of the left and a spot on the back of the left heel   55 y.o. male presents with the above complaint. History confirmed with patient.  Reports callus to the ball the left foot and heel present for 6 months.  Denies swelling denies draining.  Reports soreness and tenderness at the location  Objective:  Physical Exam: warm, good capillary refill, no trophic changes or ulcerative lesions, normal DP and PT pulses, and normal sensory exam. Left Foot: H PK submetatarsal 2 3 with pain to palpation Assessment:   1. Capsulitis of metatarsophalangeal (MTP) joint of left foot   2. Metatarsalgia, left foot    Plan:  Patient was evaluated and treated and all questions answered.  Capsulitis -Educated on etiology -Discussed padding and proper shoegear -Offloading pad applied -Injection delivered to the painful joint  No follow-ups on file.

## 2021-12-20 ENCOUNTER — Telehealth: Payer: Self-pay | Admitting: *Deleted

## 2021-12-20 NOTE — Telephone Encounter (Signed)
Patient is calling to schedule an appointment, sore callouses. Please call to schedule.

## 2021-12-20 NOTE — Telephone Encounter (Signed)
I set patient appt with Dr Samuella Cota for 12/26/21 . Tried calling patient to leave message with time, no vm . I will try him again later today.

## 2021-12-20 NOTE — Telephone Encounter (Signed)
Ok, thanks.

## 2021-12-26 ENCOUNTER — Ambulatory Visit (INDEPENDENT_AMBULATORY_CARE_PROVIDER_SITE_OTHER): Payer: BC Managed Care – PPO | Admitting: Podiatry

## 2021-12-26 ENCOUNTER — Encounter: Payer: Self-pay | Admitting: Podiatry

## 2021-12-26 DIAGNOSIS — M7742 Metatarsalgia, left foot: Secondary | ICD-10-CM | POA: Diagnosis not present

## 2021-12-26 DIAGNOSIS — M7752 Other enthesopathy of left foot: Secondary | ICD-10-CM

## 2021-12-26 NOTE — Progress Notes (Signed)
°  Subjective:  Patient ID: Joel Stephenson, male    DOB: 1966/07/30,  MRN: [id number]  Chief Complaint  Patient presents with   Callouses    I have a callus on the ball of the left foot and it will not  heal up like it should    56 y.o. male presents with the above complaint. History confirmed with patient.  States the area never really felt fully better and is still hurting.  Objective:  Physical Exam: warm, good capillary refill, no trophic changes or ulcerative lesions, normal DP and PT pulses, and normal sensory exam. Left Foot: H PK submetatarsal 2 with pain to palpation and deep core Assessment:   1. Capsulitis of metatarsophalangeal (MTP) joint of left foot   2. Metatarsalgia, left foot     Plan:  Patient was evaluated and treated and all questions answered.  Capsulitis -Educated again on etiology of the lesion -Debrided lesion down to and including the deep core -Salinocaine and band-aid applied -Discussed should this persist we could consider metatarsal osteotomy to offload the area.  Return if symptoms worsen or fail to improve.

## 2021-12-27 ENCOUNTER — Telehealth: Payer: Self-pay | Admitting: Podiatry

## 2021-12-27 MED ORDER — MELOXICAM 15 MG PO TABS: 15.0000 mg | ORAL_TABLET | Freq: Every day | ORAL | 0 refills | Status: AC

## 2021-12-27 NOTE — Telephone Encounter (Signed)
Pt was seen yesterday, states he is having soreness & wasn't given instructions on care.  Says he is unable to wear steel toe boots that are required at work and wants a note to be out of work or if there is an antibiotic or something you can call in.  [facility]

## 2021-12-27 NOTE — Addendum Note (Signed)
Addended by: Ventura Sellers on: 12/27/2021 05:26 PM   Modules accepted: Orders

## 2021-12-28 ENCOUNTER — Encounter: Payer: Self-pay | Admitting: Podiatry

## 2021-12-28 NOTE — Telephone Encounter (Signed)
Pt notified.  Work note completed.

## 2022-01-23 DIAGNOSIS — Z01818 Encounter for other preprocedural examination: Secondary | ICD-10-CM | POA: Diagnosis not present

## 2022-06-06 ENCOUNTER — Ambulatory Visit: Payer: BC Managed Care – PPO | Admitting: Podiatrist

## 2022-06-15 ENCOUNTER — Ambulatory Visit (INDEPENDENT_AMBULATORY_CARE_PROVIDER_SITE_OTHER): Payer: BC Managed Care – PPO | Admitting: Podiatry

## 2022-06-15 ENCOUNTER — Encounter: Payer: Self-pay | Admitting: Podiatry

## 2022-06-15 DIAGNOSIS — Z7409 Other reduced mobility: Secondary | ICD-10-CM | POA: Insufficient documentation

## 2022-06-15 DIAGNOSIS — Q828 Other specified congenital malformations of skin: Secondary | ICD-10-CM | POA: Diagnosis not present

## 2022-06-15 DIAGNOSIS — M79672 Pain in left foot: Secondary | ICD-10-CM | POA: Diagnosis not present

## 2022-06-22 NOTE — Progress Notes (Signed)
  Subjective:  Patient ID: Joel Stephenson, male    DOB: 1966-01-17,  MRN: [id number]  Dennard Vezina presents to clinic today for painful porokeratotic lesions left lower extremity. Pain prevent(s) comfortable ambulation. Aggravating factor is weightbearing with and without shoegear.  New problem(s): None.   PCP is Marin Comment, FNP , and last visit was  May, 2023.  Allergies  Allergen Reactions   Chlorhexidine Itching and Rash    Review of Systems: Negative except as noted in the HPI.  Objective: No changes noted in today's physical examination. Objective:   Vascular Examination: Vascular status intact b/l with palpable pedal pulses. Pedal hair present b/l. CFT immediate b/l. No edema. No pain with calf compression b/l. Skin temperature gradient WNL b/l.   Neurological Examination: Sensation grossly intact b/l with 10 gram monofilament. Vibratory sensation intact b/l.   Dermatological Examination: Pedal skin with normal turgor, texture and tone b/l. No open wounds b/l LE. No interdigital macerations noted b/l LE. Toenails 1-5 b/l well maintained with adequate length. No erythema, no edema, no drainage, no fluctuance. Porokeratotic lesion(s) submet head 2 left foot. No erythema, no edema, no drainage, no fluctuance.  Musculoskeletal Examination: Muscle strength 5/5 to b/l LE. Plantarflexed metatarsal(s) 2nd metatarsal head left lower extremity.  Radiographs: None  Assessment/Plan: 1. Porokeratosis   2. Pain in left foot   -Patient was evaluated and treated. All patient's and/or POA's questions/concerns answered on today's visit. -Patient to continue soft, supportive shoe gear daily. -Porokeratotic lesion(s) submet head 2 left foot pared and enucleated with sterile scalpel blade without incident. Salinocaine Ointment and dressing applied. He is to wash off in six hours. Total number of lesions debrided=1. -Patient/POA to call should there be question/concern in the interim.    Return in about 3 months (around 09/15/2022).  Freddie Breech, DPM

## 2022-06-27 ENCOUNTER — Ambulatory Visit: Payer: BC Managed Care – PPO | Admitting: Podiatrist

## 2022-09-28 ENCOUNTER — Ambulatory Visit (INDEPENDENT_AMBULATORY_CARE_PROVIDER_SITE_OTHER): Payer: BC Managed Care – PPO | Admitting: Podiatry

## 2022-09-28 ENCOUNTER — Encounter: Payer: Self-pay | Admitting: Podiatry

## 2022-09-28 DIAGNOSIS — Q828 Other specified congenital malformations of skin: Secondary | ICD-10-CM

## 2022-09-28 DIAGNOSIS — M79672 Pain in left foot: Secondary | ICD-10-CM | POA: Diagnosis not present

## 2022-10-04 NOTE — Progress Notes (Signed)
  Subjective:  Patient ID: Joel Stephenson, male    DOB: 04-30-1966,  MRN: [id number]  Joel Stephenson presents to clinic today for painful porokeratotic lesions left lower extremity. Pain prevent(s) comfortable ambulation. Aggravating factor is weightbearing with and without shoegear.  Chief Complaint  Patient presents with   Porokeratosis   Foot Pain    Patient presents with painful porokeratotic lesion left foot   New problem(s): None.   PCP is Marin Comment, FNP.  Allergies  Allergen Reactions   Chlorhexidine Itching and Rash    Review of Systems: Negative except as noted in the HPI.  Objective: No changes noted in today's physical examination.  Joel Stephenson is a pleasant 56 y.o. male in NAD. AAO x 3. Vascular Examination: Vascular status intact b/l with palpable pedal pulses. Pedal hair present b/l. CFT immediate b/l. No edema. No pain with calf compression b/l. Skin temperature gradient WNL b/l.   Neurological Examination: Sensation grossly intact b/l with 10 gram monofilament. Vibratory sensation intact b/l.   Dermatological Examination: Pedal skin with normal turgor, texture and tone b/l. No open wounds b/l LE. No interdigital macerations noted b/l LE. Toenails 1-5 b/l well maintained with adequate length. No erythema, no edema, no drainage, no fluctuance. Porokeratotic lesion(s) submet head 2 left foot. No erythema, no edema, no drainage, no fluctuance.  Musculoskeletal Examination: Muscle strength 5/5 to b/l LE. Plantarflexed metatarsal(s) 2nd metatarsal head left lower extremity.  Radiographs: None  Assessment/Plan: 1. Porokeratosis   2. Pain in left foot     No orders of the defined types were placed in this encounter.   -Consent given for treatment as described below: -Examined patient. -Continue supportive shoe gear daily. -Porokeratotic lesion(s) submet head 2 left foot pared and enucleated with sterile currette without incident. Salinocaine Ointment  applied with light dressing. He is to wash off in 6 hours. Patient related understanding. Total number of lesions debrided=1. -Patient/POA to call should there be question/concern in the interim.   Return in about 9 weeks (around 11/30/2022).  Freddie Breech, DPM

## 2022-11-28 ENCOUNTER — Ambulatory Visit (INDEPENDENT_AMBULATORY_CARE_PROVIDER_SITE_OTHER): Payer: BC Managed Care – PPO | Admitting: Podiatry

## 2022-11-28 ENCOUNTER — Encounter: Payer: Self-pay | Admitting: Nurse Practitioner

## 2022-11-28 DIAGNOSIS — M216X2 Other acquired deformities of left foot: Secondary | ICD-10-CM

## 2022-11-28 DIAGNOSIS — Q828 Other specified congenital malformations of skin: Secondary | ICD-10-CM | POA: Diagnosis not present

## 2022-11-28 NOTE — Progress Notes (Signed)
  Subjective:  Patient ID: Joel Stephenson, male    DOB: [birth year],  MRN: [id number]  Chief Complaint  Patient presents with   Callouses    left callus    57 y.o. male presents with the above complaint. History confirmed with patient. Patient presenting with pain related to callus present at the plantar left forefoot. Has had it shaved down multiple times in the past. Keeps coming back and causing pain. He had a similar lesion previously removed with a laser on the right foot in the past and says it has done well since then.   Objective:  Physical Exam: warm, good capillary refill nail exam normal nails without lesions DP pulses palpable, PT pulses palpable, and protective sensation intact Left Foot:  At the plantar aspect of the left 2nd met head there is a painful lesion approximately 0.5-1 cm in diameter with hyperkeratotic tissue and central hard hyperkeratotic core consistent with porokeratosis.   Right Foot: No painful lesions present plantar foot  Assessment:   1. Porokeratosis   2. Prominent metatarsal head of left foot      Plan:  Patient was evaluated and treated and all questions answered.  #Hyperkeratotic lesions consistent with porokeratosis present plantar aspect of the left 2nd met head All symptomatic hyperkeratoses x 1 separate lesions were safely debrided with a sterile #10 blade to patient's level of comfort without incident. We discussed preventative and palliative care of these lesions including supportive and accommodative shoegear, padding, prefabricated and custom molded accommodative orthoses, use of a pumice stone and lotions/creams daily. -We also discussed conservative versus surgical treatment options.  Could continue with shaving the lesion down on a routine basis every 3 months and use lotion or urea cream to try and prevent its buildup -Patient has done this and has not been effective wants to hear about possible surgical options -Discussed possible  surgical excision of the lesion with a CO2 laser. -Discussed risk benefits alternatives and possible complications.  Also discussed the possibility of performing a floating osteotomy of the second metatarsal head to prevent this prominence however patient wants to just have the lesion removed as that is what he has had done on the right side in the past. -Discussed that there could be recurrence of the painful lesion following this procedure. -Patient would like to proceed he understands the risk benefits alternatives possible complications informed surgical consent was obtained at this visit.  Will plan to proceed with surgical planning for left foot porokeratosis excision with CO2 laser.   Return for after OR.         Everitt Amber, DPM [facility]

## 2022-11-30 ENCOUNTER — Telehealth: Payer: Self-pay

## 2022-11-30 NOTE — Telephone Encounter (Signed)
Received surgery paperwork from the [facility] office. Called 11/29/2022 and 11/30/2022, voicemail has not been set up, so I can't leave a message.

## 2023-01-18 ENCOUNTER — Ambulatory Visit: Payer: BC Managed Care – PPO | Admitting: Podiatry

## 2023-02-23 ENCOUNTER — Ambulatory Visit (INDEPENDENT_AMBULATORY_CARE_PROVIDER_SITE_OTHER): Payer: BC Managed Care – PPO | Admitting: Podiatry

## 2023-02-23 DIAGNOSIS — Q828 Other specified congenital malformations of skin: Secondary | ICD-10-CM

## 2023-02-23 DIAGNOSIS — M216X2 Other acquired deformities of left foot: Secondary | ICD-10-CM

## 2023-02-23 NOTE — Progress Notes (Signed)
  Subjective:  Patient ID: Joel Stephenson, male    DOB: April 18, 1966,  MRN: [id number]  Chief Complaint  Patient presents with   routine foot care    57 y.o. male presents with the above complaint. History confirmed with patient. Patient presenting with pain related to callus present at the plantar left forefoot. Has had it shaved down multiple times in the past. Keeps coming back and causing pain. He had a similar lesion previously removed with a laser on the right foot in the past and says it has done well since then.   Objective:  Physical Exam: warm, good capillary refill nail exam normal nails without lesions DP pulses palpable, PT pulses palpable, and protective sensation intact Left Foot:  At the plantar aspect of the left 2nd met head there is a painful lesion approximately 0.5-1 cm in diameter with hyperkeratotic tissue and central hard hyperkeratotic core consistent with porokeratosis.   Right Foot: No painful lesions present plantar foot  Assessment:   1. Porokeratosis   2. Prominent metatarsal head of left foot       Plan:  Patient was evaluated and treated and all questions answered.  #Hyperkeratotic lesions consistent with porokeratosis present plantar aspect of the left 2nd met head All symptomatic hyperkeratoses x 1 separate lesions were safely debrided with a sterile #10 blade to patient's level of comfort without incident. We discussed preventative and palliative care of these lesions including supportive and accommodative shoegear, padding, prefabricated and custom molded accommodative orthoses, use of a pumice stone and lotions/creams daily. -We also discussed conservative versus surgical treatment options.  Could continue with shaving the lesion down on a routine basis every 3 months and use lotion or urea cream to try and prevent its buildup -Patient has done this and has not been effective wants to hear about possible surgical options.  Patient does not want any  bone surgery done just the lesion excised -Discussed possible surgical excision of the lesion with a CO2 laser. -Discussed that there could be recurrence of the painful lesion following this procedure. -Patient would like to proceed he understands the risk benefits alternatives possible complications informed surgical consent was obtained at this visit.  Will plan to proceed with surgical planning for left foot porokeratosis excision with CO2 laser.         Corinna Gab, DPM [facility]

## 2023-05-21 ENCOUNTER — Ambulatory Visit: Payer: BC Managed Care – PPO | Admitting: Podiatry

## 2023-05-21 DIAGNOSIS — Z91199 Patient's noncompliance with other medical treatment and regimen due to unspecified reason: Secondary | ICD-10-CM

## 2023-05-21 NOTE — Progress Notes (Signed)
Pt was a no show for apt, CG
